# Patient Record
Sex: Female | Born: 1970 | Race: White | Hispanic: No | Marital: Single | Smoking: Never smoker
Health system: Southern US, Community
[De-identification: ages and names within clinical notes are randomized; demographics above are authoritative.]

## PROBLEM LIST (undated history)

## (undated) DIAGNOSIS — I10 Essential (primary) hypertension: Secondary | ICD-10-CM

## (undated) DIAGNOSIS — G43909 Migraine, unspecified, not intractable, without status migrainosus: Secondary | ICD-10-CM

## (undated) HISTORY — PX: CHOLECYSTECTOMY: SHX55

---

## 2004-09-30 ENCOUNTER — Emergency Department: Payer: Self-pay | Admitting: Emergency Medicine

## 2005-04-19 ENCOUNTER — Emergency Department: Payer: Self-pay | Admitting: Emergency Medicine

## 2007-12-13 ENCOUNTER — Emergency Department: Payer: Self-pay | Admitting: Internal Medicine

## 2008-05-09 ENCOUNTER — Emergency Department: Payer: Self-pay | Admitting: Emergency Medicine

## 2009-04-26 ENCOUNTER — Emergency Department: Payer: Self-pay | Admitting: Emergency Medicine

## 2009-08-23 ENCOUNTER — Emergency Department: Payer: Self-pay | Admitting: Emergency Medicine

## 2010-09-21 ENCOUNTER — Emergency Department: Payer: Self-pay | Admitting: Emergency Medicine

## 2012-04-11 ENCOUNTER — Emergency Department: Payer: Self-pay | Admitting: Emergency Medicine

## 2013-03-28 ENCOUNTER — Emergency Department: Payer: Self-pay | Admitting: Emergency Medicine

## 2013-09-22 ENCOUNTER — Emergency Department: Payer: Self-pay | Admitting: Internal Medicine

## 2013-12-29 ENCOUNTER — Emergency Department: Payer: Self-pay | Admitting: Emergency Medicine

## 2014-05-08 ENCOUNTER — Emergency Department: Payer: Self-pay | Admitting: Emergency Medicine

## 2014-10-05 ENCOUNTER — Emergency Department: Payer: Self-pay | Admitting: Emergency Medicine

## 2015-03-17 ENCOUNTER — Encounter: Payer: Self-pay | Admitting: Emergency Medicine

## 2015-03-17 ENCOUNTER — Emergency Department
Admission: EM | Admit: 2015-03-17 | Discharge: 2015-03-17 | Disposition: A | Discharge status: Home / Self Care | Payer: Self-pay | Attending: Emergency Medicine | Admitting: Emergency Medicine

## 2015-03-17 DIAGNOSIS — I10 Essential (primary) hypertension: Secondary | ICD-10-CM | POA: Insufficient documentation

## 2015-03-17 DIAGNOSIS — G43009 Migraine without aura, not intractable, without status migrainosus: Secondary | ICD-10-CM | POA: Insufficient documentation

## 2015-03-17 HISTORY — DX: Migraine, unspecified, not intractable, without status migrainosus: G43.909

## 2015-03-17 HISTORY — DX: Essential (primary) hypertension: I10

## 2015-03-17 MED ORDER — METOCLOPRAMIDE HCL 10 MG PO TABS
10.0000 mg | ORAL_TABLET | Freq: Once | ORAL | Status: AC
  Administered 2015-03-17: 10 mg via ORAL
  Filled 2015-03-17: qty 1

## 2015-03-17 MED ORDER — DIPHENHYDRAMINE HCL 50 MG PO CAPS
50.0000 mg | ORAL_CAPSULE | Freq: Once | ORAL | Status: AC
  Administered 2015-03-17: 50 mg via ORAL
  Filled 2015-03-17: qty 1

## 2015-03-17 MED ORDER — SUMATRIPTAN SUCCINATE 6 MG/0.5ML ~~LOC~~ SOLN
6.0000 mg | Freq: Once | SUBCUTANEOUS | Status: AC
  Administered 2015-03-17: 6 mg via SUBCUTANEOUS
  Filled 2015-03-17: qty 0.5

## 2015-03-17 NOTE — Discharge Instructions (Signed)
Recurrent Migraine Headache A migraine headache is very bad, throbbing pain on one or both sides of your head. Recurrent migraines keep coming back. Talk to your doctor about what things may bring on (trigger) your migraine headaches. HOME CARE  Only take medicines as told by your doctor.  Lie down in a dark, quiet room when you have a migraine.  Keep a journal to find out if certain things bring on migraine headaches. For example, write down:  What you eat and drink.  How much sleep you get.  Any change to your diet or medicines.  Lessen how much alcohol you drink.  Quit smoking if you smoke.  Get enough sleep.  Lessen any stress in your life.  Keep lights dim if bright lights bother you or make your migraines worse. GET HELP IF:  Medicine does not help your migraines.  Your pain keeps coming back.  You have a fever. GET HELP RIGHT AWAY IF:   Your migraine becomes really bad.  You have a stiff neck.  You have trouble seeing.  Your muscles are weak, or you lose muscle control.  You lose your balance or have trouble walking.  You feel like you will pass out (faint), or you pass out.  You have really bad symptoms that are different than your first symptoms. MAKE SURE YOU:   Understand these instructions.  Will watch your condition.  Will get help right away if you are not doing well or get worse. Document Released: 04/22/2008 Document Revised: 07/19/2013 Document Reviewed: 03/21/2013 [facility] Patient Information 2015 [place], [place]. This information is not intended to replace advice given to you by your health care provider. Make sure you discuss any questions you have with your health care provider.  Follow-up with your provider as needed. Take OTC Excedrin Migraine and Benadryl for headache pain relief.

## 2015-03-17 NOTE — ED Notes (Signed)
History of migraines 

## 2015-03-17 NOTE — ED Provider Notes (Signed)
[facility] Emergency Department Provider Note ____________________________________________  Time seen: 1411  I have reviewed the triage vital signs and the nursing notes.  HISTORY  Chief Complaint  Migraine  HPI Colleen Robinson is a 44 y.o. female reports to the ED with a current migraine headache and gives a history of migraines. Reports onset yesterday with nausea. She denies current migraine therapy. She reportedly used to take Imitrex injections for migraines. She denies vomiting, vertigo, focal weakness, or syncope. She rates her pain at 10/10 in triage.   Past Medical History  Diagnosis Date  . Migraines   . Hypertension    There are no active problems to display for this patient.  Past Surgical History  Procedure Laterality Date  . Cholecystectomy     No current outpatient prescriptions on file.  Allergies Review of patient's allergies indicates no known allergies.  No family history on file.  Social History Social History  Substance Use Topics  . Smoking status: Never Smoker   . Smokeless tobacco: None  . Alcohol Use: No   Review of Systems  Constitutional: Negative for fever. Eyes: Negative for visual changes. ENT: Negative for sore throat. Cardiovascular: Negative for chest pain. Respiratory: Negative for shortness of breath. Gastrointestinal: Negative for abdominal pain, vomiting and diarrhea. Genitourinary: Negative for dysuria. Musculoskeletal: Negative for back pain. Skin: Negative for rash. Neurological: Negative for focal weakness or numbness. Reports headache ____________________________________________  PHYSICAL EXAM:  VITAL SIGNS: ED Triage Vitals  Enc Vitals Group     BP 03/17/15 1321 156/93 mmHg     Pulse Rate 03/17/15 1321 73     Resp 03/17/15 1321 18     Temp 03/17/15 1321 98.1 F (36.7 C)     Temp Source 03/17/15 1321 Oral     SpO2 03/17/15 1321 100 %     Weight 03/17/15 1321 240 lb (108.863 kg)     Height  03/17/15 1321 5\' 8"  (1.727 m)     Head Cir --      Peak Flow --      Pain Score 03/17/15 1322 10     Pain Loc --      Pain Edu? --      Excl. in GC? --    Constitutional: Alert and oriented. Well appearing and in no distress. Eyes: Conjunctivae are normal. PERRL. Normal extraocular movements. Normal fundi bilaterally. Ears: Canals clear, TMs intact without bulging   Head: Normocephalic and atraumatic.   Nose: No congestion/rhinnorhea.   Mouth/Throat: Mucous membranes are moist.   Neck: Supple. No thyromegaly. Hematological/Lymphatic/Immunilogical: No cervical lymphadenopathy. Cardiovascular: Normal rate, regular rhythm.  Respiratory: Normal respiratory effort. No wheezes/rales/rhonchi. Gastrointestinal: Soft and nontender. No distention. Musculoskeletal: Nontender with normal range of motion in all extremities.  Neurologic:  Normal gait without ataxia. Normal speech and language. No gross focal neurologic deficits are appreciated. Skin:  Skin is warm, dry and intact. No rash noted. Psychiatric: Mood and affect are normal. Patient exhibits appropriate insight and judgment. ____________________  PROCEDURES Imitrex 6mg  SQ Reglan 10 mg PO Benadryl 50 mg PO ____________________________________________  INITIAL IMPRESSION / ASSESSMENT AND PLAN / ED COURSE  Patient with near resolution of headache pain after med administration. Suggest follow-up with [employer] or one of the community clinics for ongoing health maintenance. Suggest Excedrin Migraine and Benadryl for headache pain relief.  ____________________________________________  FINAL CLINICAL IMPRESSION(S) / ED DIAGNOSES  Final diagnoses:  Migraine without aura and without status migrainosus, not intractable     Shakenya Stoneberg  Kate Sable, PA-C 03/17/15 1526  Darien Ramus, MD 03/17/15 1539

## 2016-01-12 ENCOUNTER — Encounter: Payer: Self-pay | Admitting: Emergency Medicine

## 2016-01-12 ENCOUNTER — Emergency Department
Admission: EM | Admit: 2016-01-12 | Discharge: 2016-01-12 | Disposition: A | Discharge status: Home / Self Care | Payer: Self-pay | Attending: Emergency Medicine | Admitting: Emergency Medicine

## 2016-01-12 DIAGNOSIS — G43009 Migraine without aura, not intractable, without status migrainosus: Secondary | ICD-10-CM

## 2016-01-12 DIAGNOSIS — K029 Dental caries, unspecified: Secondary | ICD-10-CM

## 2016-01-12 DIAGNOSIS — I10 Essential (primary) hypertension: Secondary | ICD-10-CM | POA: Insufficient documentation

## 2016-01-12 MED ORDER — ONDANSETRON 4 MG PO TBDP
4.0000 mg | ORAL_TABLET | Freq: Once | ORAL | Status: AC
  Administered 2016-01-12: 4 mg via ORAL
  Filled 2016-01-12: qty 1

## 2016-01-12 MED ORDER — SUMATRIPTAN SUCCINATE 6 MG/0.5ML ~~LOC~~ SOLN
6.0000 mg | Freq: Once | SUBCUTANEOUS | Status: AC
  Administered 2016-01-12: 6 mg via SUBCUTANEOUS
  Filled 2016-01-12: qty 0.5

## 2016-01-12 MED ORDER — AMOXICILLIN 500 MG PO CAPS: 500.0000 mg | ORAL_CAPSULE | Freq: Three times a day (TID) | ORAL | Status: AC

## 2016-01-12 MED ORDER — IBUPROFEN 600 MG PO TABS: 600.0000 mg | ORAL_TABLET | Freq: Three times a day (TID) | ORAL | Status: AC | PRN

## 2016-01-12 NOTE — ED Notes (Signed)
Pt presents to ED with c/o front upper jaw/sinus pain and pressure d/t dental pain. Pt also states h/x of migraine HA. Pt reports N/V associated with HA, denies diarrhea or fever. Pt reports light and sound makes HA worse; pt reports taking Tylenol and Benadryl PTA with relief.

## 2016-01-12 NOTE — ED Notes (Signed)
Toothache and migraine x 2 days.

## 2016-01-12 NOTE — Discharge Instructions (Signed)
Begin taking amoxicillin for dental infection and ibuprofen as needed for dental pain. Follow-up with your primary care doctor or [facility] if any continued problems with migraines.

## 2016-01-12 NOTE — ED Notes (Signed)
Discussed discharge instructions, prescriptions, and follow-up care with patient. No questions or concerns at this time. Pt stable at discharge.  

## 2016-01-12 NOTE — ED Provider Notes (Signed)
[facility] Emergency Department Provider Note   ____________________________________________  Time seen: Approximately 2:55 PM  I have reviewed the triage vital signs and the nursing notes.   HISTORY  Chief Complaint Dental Pain and Migraine   HPI Colleen Robinson is a 45 y.o. female history complaint of dental pain. Patient states that she has left upper dental pain along with a migraine that is been going on for approximately 2 days.  Patient is aware that she needs dental work doneand has a history of migraines. In the past she has them treated with Imitrex with complete relief of her headaches. She states that it is been 2-3 years since she has seen her primary care doctor at [facility] primary. She states the headache that she is experiencing today is like her normal migraine. She is experienced nausea but no vomiting. She has taken over-the-counter medication without any relief. She denies any visual changes, fever or chills. Currently she does not have any insurance and states that she is unable see her PCP at [facility] primary or make an appointment for dentist. Currently she rates her pain as an 8/10.   Past Medical History  Diagnosis Date  . Migraines   . Hypertension     There are no active problems to display for this patient.   Past Surgical History  Procedure Laterality Date  . Cholecystectomy      Current Outpatient Rx  Name  Route  Sig  Dispense  Refill  . amoxicillin (AMOXIL) 500 MG capsule   Oral   Take 1 capsule (500 mg total) by mouth 3 (three) times daily.   30 capsule   0   . ibuprofen (ADVIL,MOTRIN) 600 MG tablet   Oral   Take 1 tablet (600 mg total) by mouth every 8 (eight) hours as needed.   30 tablet   0     Allergies Review of patient's allergies indicates no known allergies.  No family history on file.  Social History Social History  Substance Use Topics  . Smoking status: Never Smoker   . Smokeless tobacco: None  .  Alcohol Use: No    Review of Systems Constitutional: No fever/chills Eyes: Positive photophobia ENT: Positive dental pain. Cardiovascular: Denies chest pain. Respiratory: Denies shortness of breath. Gastrointestinal: No abdominal pain.  Positive nausea, no vomiting.   Musculoskeletal: Negative for back pain. Skin: Negative for rash. Neurological: Positive for headaches, no focal weakness or numbness.  10-point ROS otherwise negative.  ____________________________________________   PHYSICAL EXAM:  VITAL SIGNS: ED Triage Vitals  Enc Vitals Group     BP 01/12/16 1413 133/98 mmHg     Pulse Rate 01/12/16 1413 110     Resp 01/12/16 1413 18     Temp 01/12/16 1413 98.3 F (36.8 C)     Temp Source 01/12/16 1413 Oral     SpO2 01/12/16 1413 99 %     Weight 01/12/16 1413 240 lb (108.863 kg)     Height 01/12/16 1413  (1.753 m)     Head Cir --      Peak Flow --      Pain Score 01/12/16 1414 8     Pain Loc --      Pain Edu? --      Excl. in GC? --     Constitutional: Alert and oriented. Well appearing and in no acute distress.Patient is sitting in a dimly lit room secondary to headache. Patient answers questions appropriately. Eyes: Conjunctivae are  normal. PERRL. EOMI. Head: Atraumatic. Nose: No congestion/rhinnorhea. Mouth/Throat: Mucous membranes are moist.  Oropharynx non-erythematous. There are numerous caries present upper front teeth. There is some gum tenderness along with some minimal swelling. All teeth are in poor hygiene. Neck: No stridor.  Range of motion of cervical spine is within normal limits and without restriction. Hematological/Lymphatic/Immunilogical: No cervical lymphadenopathy. Cardiovascular: Normal rate, regular rhythm. Grossly normal heart sounds.  Good peripheral circulation. Respiratory: Normal respiratory effort.  No retractions. Lungs CTAB. Musculoskeletal: Moves upper and lower extremities without any difficulty. Normal gait was  noted. Neurologic:  Normal speech and language. No gross focal neurologic deficits are appreciated. No gait instability. Cranial nerves II through XII grossly intact. Reflexes patella tendons 2+ bilaterally. Skin:  Skin is warm, dry and intact. No rash noted. Psychiatric: Mood and affect are normal. Speech and behavior are normal.  ____________________________________________   LABS (all labs ordered are listed, but only abnormal results are displayed)  Labs Reviewed - No data to display  PROCEDURES  Procedure(s) performed: None  Critical Care performed: No  ____________________________________________   INITIAL IMPRESSION / ASSESSMENT AND PLAN / ED COURSE  Pertinent labs & imaging results that were available during my care of the patient were reviewed by me and considered in my medical decision making (see chart for details).  By discharge patient was completely headache free. Patient continues to have some dental discomfort. She was encouraged to make an appointment with the dentist. A list of dental clinics was given to the patient. Patient is started on amoxicillin 500 mg 3 times a day for 10 days and take ibuprofen as needed for pain and discomfort. She is encouraged to reestablish her PCP for treatment of her migraines. ____________________________________________   FINAL CLINICAL IMPRESSION(S) / ED DIAGNOSES  Final diagnoses:  Migraine without aura and without status migrainosus, not intractable  Dental caries  Pain due to dental caries      NEW MEDICATIONS STARTED DURING THIS VISIT:  Discharge Medication List as of 01/12/2016  5:29 PM    START taking these medications   Details  amoxicillin (AMOXIL) 500 MG capsule Take 1 capsule (500 mg total) by mouth 3 (three) times daily., Starting 01/12/2016, Until Discontinued, Print    ibuprofen (ADVIL,MOTRIN) 600 MG tablet Take 1 tablet (600 mg total) by mouth every 8 (eight) hours as needed., Starting 01/12/2016, Until  Discontinued, Print         Note:  This document was prepared using Dragon voice recognition software and may include unintentional dictation errors.    Tommi RumpsRhonda L Summers, PA-C 01/12/16 1748  Jennye MoccasinBrian S Quigley, MD 01/13/16 [phone]

## 2016-03-31 ENCOUNTER — Emergency Department
Admission: EM | Admit: 2016-03-31 | Discharge: 2016-03-31 | Disposition: A | Discharge status: Home / Self Care | Payer: Self-pay | Attending: Emergency Medicine | Admitting: Emergency Medicine

## 2016-03-31 ENCOUNTER — Encounter: Payer: Self-pay | Admitting: *Deleted

## 2016-03-31 DIAGNOSIS — Z792 Long term (current) use of antibiotics: Secondary | ICD-10-CM | POA: Insufficient documentation

## 2016-03-31 DIAGNOSIS — Z791 Long term (current) use of non-steroidal anti-inflammatories (NSAID): Secondary | ICD-10-CM | POA: Insufficient documentation

## 2016-03-31 DIAGNOSIS — I10 Essential (primary) hypertension: Secondary | ICD-10-CM | POA: Insufficient documentation

## 2016-03-31 DIAGNOSIS — G43809 Other migraine, not intractable, without status migrainosus: Secondary | ICD-10-CM

## 2016-03-31 MED ORDER — SUMATRIPTAN SUCCINATE 6 MG/0.5ML ~~LOC~~ SOLN
6.0000 mg | Freq: Once | SUBCUTANEOUS | Status: AC
  Administered 2016-03-31: 6 mg via SUBCUTANEOUS
  Filled 2016-03-31 (×2): qty 0.5

## 2016-03-31 NOTE — Discharge Instructions (Signed)
Please seek medical attention for any high fevers, chest pain, shortness of breath, change in behavior, persistent vomiting, bloody stool or any other new or concerning symptoms.  

## 2016-03-31 NOTE — ED Triage Notes (Signed)
States migraine for 2 days, states hx of migraines, states she is unable to afford her imitrex

## 2016-03-31 NOTE — ED Provider Notes (Signed)
[facility] Emergency Department Provider Note    ____________________________________________   I have reviewed the triage vital signs and the nursing notes.   HISTORY  Chief Complaint Migraine   History limited by: Not Limited   HPI Colleen Robinson is a 45 y.o. female with history of migraines who presents to the emergency department today because of headache. It is been present for the past 2 days. It is located behind and above her eyes. It has been fairly constant. It is been associated with some nausea. It feels like her previous migraines. She denies any recent head trauma. She currently does not have a primary care doctor nor any migraine medication.   Past Medical History:  Diagnosis Date  . Hypertension   . Migraines     There are no active problems to display for this patient.   Past Surgical History:  Procedure Laterality Date  . CHOLECYSTECTOMY      Prior to Admission medications   Medication Sig Start Date End Date Taking? Authorizing Provider  amoxicillin (AMOXIL) 500 MG capsule Take 1 capsule (500 mg total) by mouth 3 (three) times daily. 01/12/16   Tommi Rumps, PA-C  ibuprofen (ADVIL,MOTRIN) 600 MG tablet Take 1 tablet (600 mg total) by mouth every 8 (eight) hours as needed. 01/12/16   Tommi Rumps, PA-C    Allergies Review of patient's allergies indicates no known allergies.  History reviewed. No pertinent family history.  Social History Social History  Substance Use Topics  . Smoking status: Never Smoker  . Smokeless tobacco: Not on file  . Alcohol use No    Review of Systems  Constitutional: Negative for fever. Cardiovascular: Negative for chest pain. Respiratory: Negative for shortness of breath. Gastrointestinal: Negative for abdominal pain, vomiting and diarrhea. Neurological: Positive for headache.  10-point ROS otherwise negative.  ____________________________________________   PHYSICAL  EXAM:  VITAL SIGNS: ED Triage Vitals [03/31/16 1701]  Enc Vitals Group     BP (!) 158/94     Pulse Rate 86     Resp 18     Temp 98 F (36.7 C)     Temp Source Oral     SpO2 100 %     Weight 230 lb (104.3 kg)     Height 5\' 8"  (1.727 m)     Head Circumference      Peak Flow      Pain Score 9     Pain Loc    Constitutional: Alert and oriented. Well appearing and in no distress. Eyes: Conjunctivae are normal. Normal extraocular movements. ENT   Head: Normocephalic and atraumatic.   Nose: No congestion/rhinnorhea.   Mouth/Throat: Mucous membranes are moist.   Neck: No stridor. Hematological/Lymphatic/Immunilogical: No cervical lymphadenopathy. Cardiovascular: Normal rate, regular rhythm.  No murmurs, rubs, or gallops. Respiratory: Normal respiratory effort without tachypnea nor retractions. Breath sounds are clear and equal bilaterally. No wheezes/rales/rhonchi. Gastrointestinal: Soft and nontender. No distention.  Genitourinary: Deferred Musculoskeletal: Normal range of motion in all extremities. No lower extremity edema. Neurologic:  Normal speech and language. No gross focal neurologic deficits are appreciated.  Skin:  Skin is warm, dry and intact. No rash noted. Psychiatric: Mood and affect are normal. Speech and behavior are normal. Patient exhibits appropriate insight and judgment.  ____________________________________________    LABS (pertinent positives/negatives)  Labs Reviewed - No data to display   ____________________________________________   EKG  None  ____________________________________________    RADIOLOGY  None  ____________________________________________   PROCEDURES  Procedures  ____________________________________________   INITIAL IMPRESSION / ASSESSMENT AND PLAN / ED COURSE  Pertinent labs & imaging results that were available during my care of the patient were reviewed by me and considered in my medical decision  making (see chart for details).  Patient with history of migraines presents to the emergency department tonight with a typical migraine. She was given Imitrex and stated she felt much better afterwards. Will discharge home. ____________________________________________   FINAL CLINICAL IMPRESSION(S) / ED DIAGNOSES  Final diagnoses:  Other type of migraine     Note: This dictation was prepared with Dragon dictation. Any transcriptional errors that result from this process are unintentional    Phineas SemenGraydon Teandra Harlan, MD 03/31/16 1956

## 2017-01-22 ENCOUNTER — Emergency Department
Admission: EM | Admit: 2017-01-22 | Discharge: 2017-01-22 | Disposition: A | Discharge status: Home / Self Care | Payer: Self-pay | Attending: Emergency Medicine | Admitting: Emergency Medicine

## 2017-01-22 ENCOUNTER — Encounter: Payer: Self-pay | Admitting: Emergency Medicine

## 2017-01-22 DIAGNOSIS — I10 Essential (primary) hypertension: Secondary | ICD-10-CM | POA: Insufficient documentation

## 2017-01-22 DIAGNOSIS — G43901 Migraine, unspecified, not intractable, with status migrainosus: Secondary | ICD-10-CM | POA: Insufficient documentation

## 2017-01-22 DIAGNOSIS — H53149 Visual discomfort, unspecified: Secondary | ICD-10-CM | POA: Insufficient documentation

## 2017-01-22 DIAGNOSIS — R112 Nausea with vomiting, unspecified: Secondary | ICD-10-CM | POA: Insufficient documentation

## 2017-01-22 MED ORDER — PROMETHAZINE HCL 25 MG/ML IJ SOLN
12.5000 mg | Freq: Once | INTRAMUSCULAR | Status: AC
  Administered 2017-01-22: 12.5 mg via INTRAMUSCULAR
  Filled 2017-01-22: qty 1

## 2017-01-22 MED ORDER — DIPHENHYDRAMINE HCL 25 MG PO CAPS
50.0000 mg | ORAL_CAPSULE | Freq: Once | ORAL | Status: AC
  Administered 2017-01-22: 50 mg via ORAL
  Filled 2017-01-22: qty 2

## 2017-01-22 MED ORDER — PROMETHAZINE HCL 25 MG/ML IJ SOLN: 12.5000 mg | Freq: Once | INTRAMUSCULAR | Status: DC

## 2017-01-22 MED ORDER — SUMATRIPTAN SUCCINATE 6 MG/0.5ML ~~LOC~~ SOLN
6.0000 mg | Freq: Once | SUBCUTANEOUS | Status: AC
  Administered 2017-01-22: 6 mg via SUBCUTANEOUS
  Filled 2017-01-22: qty 0.5

## 2017-01-22 NOTE — ED Triage Notes (Addendum)
Pt c/o migraine headache, hx of same.  Started today.  Has had vomiting.  Photophobia.  Has not been able to get her BP meds or migraine medication because without insurance.  Feels like her normal migraines just does not have her medication. Reports just needs something for nausea/headache.

## 2017-01-22 NOTE — ED Provider Notes (Signed)
[facility] Emergency Department Provider Note  ____________________________________________  Time seen: Approximately 5:46 PM  I have reviewed the triage vital signs and the nursing notes.   HISTORY  Chief Complaint Headache    HPI Colleen Robinson is a 46 y.o. female presenting to the emergency department with 10 out of 10 frontal headache that started this morning. Patient has associated photophobia, phonophobia, nausea and vomiting. Patient has a history of chronic migraines. Patient states that it has been approximately 1 month since she abstained from a pharmacologic regimen for essential hypertension. She states that she is in the process of seeking care with a primary care provider. Patient states that this is not the worst headache of her life. She is ambulating without difficulty. She denies associated blurry vision. Patient is not currently working. She enjoys spending time with her 74-year-old granddaughter in her spare time.   Past Medical History:  Diagnosis Date  . Hypertension   . Migraines     There are no active problems to display for this patient.   Past Surgical History:  Procedure Laterality Date  . CHOLECYSTECTOMY      Prior to Admission medications   Medication Sig Start Date End Date Taking? Authorizing Provider  amoxicillin (AMOXIL) 500 MG capsule Take 1 capsule (500 mg total) by mouth 3 (three) times daily. 01/12/16   Tommi Rumps, PA-C  ibuprofen (ADVIL,MOTRIN) 600 MG tablet Take 1 tablet (600 mg total) by mouth every 8 (eight) hours as needed. 01/12/16   Tommi Rumps, PA-C    Allergies Patient has no known allergies.  History reviewed. No pertinent family history.  Social History Social History  Substance Use Topics  . Smoking status: Never Smoker  . Smokeless tobacco: Not on file  . Alcohol use No    Review of Systems  Constitutional: No fever/chills Eyes: No visual changes. No discharge ENT: No upper  respiratory complaints. Cardiovascular: no chest pain. Respiratory: no cough. No SOB. Gastrointestinal: Patient has nausea and vomiting.  Musculoskeletal: Negative for musculoskeletal pain. Skin: Negative for rash, abrasions, lacerations, ecchymosis. Neurological: Patient has headache, no focal weakness or numbness.   ____________________________________________   PHYSICAL EXAM:  VITAL SIGNS: ED Triage Vitals  Enc Vitals Group     BP 01/22/17 1713 (!) 179/95     Pulse Rate 01/22/17 1713 80     Resp 01/22/17 1713 18     Temp 01/22/17 1713 98.2 F (36.8 C)     Temp Source 01/22/17 1713 Oral     SpO2 01/22/17 1713 100 %     Weight 01/22/17 1710 230 lb (104.3 kg)     Height 01/22/17 1710 5\' 8"  (1.727 m)     Head Circumference --      Peak Flow --      Pain Score 01/22/17 1709 10     Pain Loc --      Pain Edu? --      Excl. in GC? --    Constitutional: Alert and oriented. Patient is talkative and engaged.  Eyes: Palpebral and bulbar conjunctiva are nonerythematous bilaterally. PERRL. EOMI.  Head: Atraumatic. ENT: Neck: Full range of motion. No pain with neck flexion. No pain with palpation of the cervical spine.  Cardiovascular: No pain with palpation over the anterior and posterior chest wall. Normal rate, regular rhythm. Normal S1 and S2. No murmurs, gallops or rubs auscultated.  Respiratory: Trachea is midline. Resonant and symmetric percussion tones bilaterally. On auscultation, adventitious sounds are absent.  Musculoskeletal: Patient has 5/5 strength in the upper and lower extremities bilaterally. Full range of motion at the shoulder, elbow and wrist bilaterally. Full range of motion at the hip, knee and ankle bilaterally. No changes in gait. Palpable radial, ulnar and dorsalis pedis pulses bilaterally and symmetrically. Neurologic: Normal speech and language. No gross focal neurologic deficits are appreciated. Cranial nerves: 2-10 normal as tested. Cerebellar:  Finger-nose-finger WNL, heel to shin WNL. Sensorimotor: No sensory loss or abnormal reflexes. Vision: No visual field deficts noted to confrontation.  Speech: No dysarthria or expressive aphasia.  Skin:  Skin is warm, dry and intact. No rash or bruising noted.  Psychiatric: Mood and affect are normal for age. Speech and behavior are normal.    ____________________________________________   LABS (all labs ordered are listed, but only abnormal results are displayed)  Labs Reviewed - No data to display ____________________________________________  EKG   ____________________________________________  RADIOLOGY   No results found.  ____________________________________________    PROCEDURES  Procedure(s) performed:    Procedures    Medications  SUMAtriptan (IMITREX) injection 6 mg (6 mg Subcutaneous Given 01/22/17 1805)  diphenhydrAMINE (BENADRYL) capsule 50 mg (50 mg Oral Given 01/22/17 1801)  promethazine (PHENERGAN) injection 12.5 mg (12.5 mg Intramuscular Given 01/22/17 1802)     ____________________________________________   INITIAL IMPRESSION / ASSESSMENT AND PLAN / ED COURSE  Pertinent labs & imaging results that were available during my care of the patient were reviewed by me and considered in my medical decision making (see chart for details).  Review of the [place] CSRS was performed in accordance of the [facility] prior to dispensing any controlled drugs.     Assessment and plan: Migraine: Patient presents to the emergency department with a migraine. She was given Imitrex, Phenergan and Benadryl in the emergency department. Patient reports that this is not the worst headache of her life. Physical exam is reassuring. Further workup with a CT head is not warranted at this time. Patient reported that her headache improved significantly after aforementioned medications were administered. Patient was advised to follow up with primary care for essential hypertension.  Patient voiced understanding regarding this recommendation.   ____________________________________________  FINAL CLINICAL IMPRESSION(S) / ED DIAGNOSES  Final diagnoses:  Migraine with status migrainosus, not intractable, unspecified migraine type      NEW MEDICATIONS STARTED DURING THIS VISIT:  New Prescriptions   No medications on file        This chart was dictated using voice recognition software/Dragon. Despite best efforts to proofread, errors can occur which can change the meaning. Any change was purely unintentional.    Orvil FeilWoods, Terrace Fontanilla M, PA-C 01/22/17 1906    Jeanmarie PlantMcShane, James A, MD 01/22/17 1945

## 2017-02-08 ENCOUNTER — Emergency Department
Admission: EM | Admit: 2017-02-08 | Discharge: 2017-02-09 | Disposition: A | Discharge status: Home / Self Care | Payer: Self-pay | Attending: Student in an Organized Health Care Education/Training Program | Admitting: Student in an Organized Health Care Education/Training Program

## 2017-02-08 DIAGNOSIS — G43809 Other migraine, not intractable, without status migrainosus: Secondary | ICD-10-CM | POA: Insufficient documentation

## 2017-02-08 DIAGNOSIS — I10 Essential (primary) hypertension: Secondary | ICD-10-CM | POA: Insufficient documentation

## 2017-02-08 DIAGNOSIS — Z79899 Other long term (current) drug therapy: Secondary | ICD-10-CM | POA: Insufficient documentation

## 2017-02-08 MED ORDER — SUMATRIPTAN SUCCINATE 6 MG/0.5ML ~~LOC~~ SOLN
6.0000 mg | Freq: Once | SUBCUTANEOUS | Status: AC
  Administered 2017-02-08: 6 mg via SUBCUTANEOUS
  Filled 2017-02-08: qty 0.5

## 2017-02-08 MED ORDER — PROMETHAZINE HCL 25 MG/ML IJ SOLN
25.0000 mg | Freq: Four times a day (QID) | INTRAMUSCULAR | Status: DC | PRN
  Administered 2017-02-08: 25 mg via INTRAMUSCULAR
  Filled 2017-02-08: qty 1

## 2017-02-08 MED ORDER — SUMATRIPTAN 5 MG/ACT NA SOLN: 1.0000 | NASAL | 0 refills | Status: AC | PRN

## 2017-02-08 NOTE — Discharge Instructions (Signed)

## 2017-02-08 NOTE — ED Notes (Signed)
Dr Roxan Hockeyrobinson at bedside, pt states that she woke up with a migraine, states hx of migraines and has used imitrex in the past but states that she doesn't have insurance and is unable to afford it. Pt denies any neurological s/s but does report nausea. Pt has equal facial symetry and equal body movements of her extremities. Pupils are 2 mm bilat and brisk

## 2017-02-08 NOTE — ED Provider Notes (Signed)
[facility] Emergency Department Provider Note    First MD Initiated Contact with Patient 02/08/17 2210     (approximate)  I have reviewed the triage vital signs and the nursing notes.   HISTORY  Chief Complaint Migraine (History of same)    HPI Karen Kitchensmanda D Scheff is a 46 y.o. female with a history of migraines presents with headache associated with nausea vomiting that started this morning. Feels similar to previous migraine headaches. States that she typically gets significant relief after Imitrex but ran out of her medications that she lost her insurance. Denies any fevers. No neck pain. No numbness or tingling.   Past Medical History:  Diagnosis Date  . Hypertension   . Migraines    No family history on file. Past Surgical History:  Procedure Laterality Date  . CHOLECYSTECTOMY     There are no active problems to display for this patient.     Prior to Admission medications   Medication Sig Start Date End Date Taking? Authorizing Provider  amoxicillin (AMOXIL) 500 MG capsule Take 1 capsule (500 mg total) by mouth 3 (three) times daily. 01/12/16   Tommi RumpsSummers, Rhonda L, PA-C  ibuprofen (ADVIL,MOTRIN) 600 MG tablet Take 1 tablet (600 mg total) by mouth every 8 (eight) hours as needed. 01/12/16   Tommi RumpsSummers, Rhonda L, PA-C    Allergies Patient has no known allergies.    Social History Social History  Substance Use Topics  . Smoking status: Never Smoker  . Smokeless tobacco: Not on file  . Alcohol use No    Review of Systems Patient denies headaches, rhinorrhea, blurry vision, numbness, shortness of breath, chest pain, edema, cough, abdominal pain, nausea, vomiting, diarrhea, dysuria, fevers, rashes or hallucinations unless otherwise stated above in HPI. ____________________________________________   PHYSICAL EXAM:  VITAL SIGNS: Vitals:   02/08/17 1931  BP: (!) 156/86  Pulse: 73  Resp: 18  Temp: 98.4 F (36.9 C)    Constitutional: Alert  and oriented. Well appearing and in no acute distress. Eyes: Conjunctivae are normal.  Head: Atraumatic. Nose: No congestion/rhinnorhea. Mouth/Throat: Mucous membranes are moist.   Neck: No stridor. Painless ROM.  Cardiovascular: Normal rate, regular rhythm. Grossly normal heart sounds.  Good peripheral circulation. Respiratory: Normal respiratory effort.  No retractions. Lungs CTAB. Gastrointestinal: Soft and nontender. No distention. No abdominal bruits. No CVA tenderness. Genitourinary:  Musculoskeletal: No lower extremity tenderness nor edema.  No joint effusions. Neurologic:  Normal speech and language. No gross focal neurologic deficits are appreciated. No facial droop Skin:  Skin is warm, dry and intact. No rash noted. Psychiatric: Mood and affect are normal. Speech and behavior are normal.  ____________________________________________   LABS (all labs ordered are listed, but only abnormal results are displayed)  No results found for this or any previous visit (from the past 24 hour(s)). ____________________________________________ ____________________________________________   PROCEDURES  Procedure(s) performed:  Procedures    Critical Care performed: no ____________________________________________   INITIAL IMPRESSION / ASSESSMENT AND PLAN / ED COURSE  Pertinent labs & imaging results that were available during my care of the patient were reviewed by me and considered in my medical decision making (see chart for details).  DDX: migraine, tension, cluster  Karen Kitchensmanda D Blattner is a 46 y.o. who presents to the ED with with Hx of migraines p/w HA starting today.  Not worst HA ever. Gradual onset. HA similar to previous episodes. Denies focal neurologic symptoms. Denies trauma. No fevers or neck pain. No vision loss. Afebrile in  ED. VSS. Exam as above. No meningeal signs. No CN, motor, sensory or cerebellar deficits. Temporal arteries palpable and non-tender. Appears well and  non-toxic.  Will provide IM imitrex as this has been very effective in the past    Clinical Course as of Feb 09 2340  Sun Feb 08, 2017  2337 Patient reassessed. Symptoms markedly improved after Imitrex. Patient tolerating oral hydration able to and related with steady gait.  [PR]    Clinical Course User Index [PR] Willy Eddy, MD     ____________________________________________   FINAL CLINICAL IMPRESSION(S) / ED DIAGNOSES  Final diagnoses:  Other migraine without status migrainosus, not intractable      NEW MEDICATIONS STARTED DURING THIS VISIT:  New Prescriptions   No medications on file     Note:  This document was prepared using Dragon voice recognition software and may include unintentional dictation errors.    Willy Eddy, MD 02/08/17 [phone]

## 2017-02-08 NOTE — ED Triage Notes (Signed)
Onset this morning of left sided migraine associated with N/V and photo and phonophobia. Patient is able to keep eyes open and speaks in complete sentences. Has a prescription for Imitrex injection but is out of it at home. That is written by Dr. Letta PateAycock at [facility] [place].

## 2017-02-08 NOTE — ED Notes (Signed)
Pt resting quietly with lights turned down watching tv, cont to monitor

## 2017-02-09 NOTE — ED Notes (Signed)
Pt states that she is feeling better, no distress noted, cont to monitor

## 2017-06-30 ENCOUNTER — Encounter: Payer: Self-pay | Admitting: *Deleted

## 2017-06-30 ENCOUNTER — Emergency Department
Admission: EM | Admit: 2017-06-30 | Discharge: 2017-06-30 | Disposition: A | Discharge status: Home / Self Care | Payer: Self-pay | Attending: Emergency Medicine | Admitting: Emergency Medicine

## 2017-06-30 ENCOUNTER — Emergency Department: Payer: Self-pay

## 2017-06-30 ENCOUNTER — Other Ambulatory Visit: Payer: Self-pay

## 2017-06-30 DIAGNOSIS — Z79899 Other long term (current) drug therapy: Secondary | ICD-10-CM | POA: Insufficient documentation

## 2017-06-30 DIAGNOSIS — Y929 Unspecified place or not applicable: Secondary | ICD-10-CM | POA: Insufficient documentation

## 2017-06-30 DIAGNOSIS — Y939 Activity, unspecified: Secondary | ICD-10-CM | POA: Insufficient documentation

## 2017-06-30 DIAGNOSIS — S99911A Unspecified injury of right ankle, initial encounter: Secondary | ICD-10-CM

## 2017-06-30 DIAGNOSIS — S99912A Unspecified injury of left ankle, initial encounter: Secondary | ICD-10-CM | POA: Insufficient documentation

## 2017-06-30 DIAGNOSIS — W108XXA Fall (on) (from) other stairs and steps, initial encounter: Secondary | ICD-10-CM | POA: Insufficient documentation

## 2017-06-30 DIAGNOSIS — I10 Essential (primary) hypertension: Secondary | ICD-10-CM | POA: Insufficient documentation

## 2017-06-30 DIAGNOSIS — Y999 Unspecified external cause status: Secondary | ICD-10-CM | POA: Insufficient documentation

## 2017-06-30 NOTE — ED Provider Notes (Signed)
[facility] Emergency Department Provider Note  ____________________________________________  Time seen: Approximately 9:26 PM  I have reviewed the triage vital signs and the nursing notes.   HISTORY  Chief Complaint Fall and Ankle Pain    HPI Colleen Robinson is a 46 y.o. female that presents to the emergency department for evaluation of ankle pain after tripping down stairs.  Patient states that she was carrying her granddaughter and her left arm and a car seat in her right arm, which caused her to trip.  She believes she fell down about 3 steps.  She is not sure which way she rolled her left ankle.  Patient has had difficulty moving left ankle since injury.  She has been walking with a walker.  No additional injuries.  She did not hit her head or lose consciousness.  No shortness breath, chest pain, nausea, vomiting, abdominal pain, numbness, tingling.  Past Medical History:  Diagnosis Date  . Hypertension   . Migraines     There are no active problems to display for this patient.   Past Surgical History:  Procedure Laterality Date  . CHOLECYSTECTOMY      Prior to Admission medications   Medication Sig Start Date End Date Taking? Authorizing Provider  amoxicillin (AMOXIL) 500 MG capsule Take 1 capsule (500 mg total) by mouth 3 (three) times daily. 01/12/16   Tommi RumpsSummers, Rhonda L, PA-C  ibuprofen (ADVIL,MOTRIN) 600 MG tablet Take 1 tablet (600 mg total) by mouth every 8 (eight) hours as needed. 01/12/16   Tommi RumpsSummers, Rhonda L, PA-C  SUMAtriptan (IMITREX) 5 MG/ACT nasal spray Place 1 spray (5 mg total) into the nose every 2 (two) hours as needed for migraine. 02/08/17   Willy Eddyobinson, Patrick, MD    Allergies Patient has no known allergies.  History reviewed. No pertinent family history.  Social History Social History   Tobacco Use  . Smoking status: Never Smoker  . Smokeless tobacco: Never Used  Substance Use Topics  . Alcohol use: No  . Drug use: No      Review of Systems  Constitutional: No fever/chills Cardiovascular: No chest pain. Respiratory: No SOB. Gastrointestinal: No abdominal pain.  No nausea, no vomiting.  Musculoskeletal: Positive for ankle pain. Skin: Negative for rash, abrasions, lacerations, ecchymosis. Neurological: Negative for headaches, numbness or tingling   ____________________________________________   PHYSICAL EXAM:  VITAL SIGNS: ED Triage Vitals  Enc Vitals Group     BP 06/30/17 1950 (!) 165/87     Pulse Rate 06/30/17 1950 85     Resp 06/30/17 1950 18     Temp 06/30/17 1950 98.2 F (36.8 C)     Temp Source 06/30/17 1950 Oral     SpO2 06/30/17 1950 100 %     Weight 06/30/17 1950 220 lb (99.8 kg)     Height 06/30/17 1950 5\' 9"  (1.753 m)     Head Circumference --      Peak Flow --      Pain Score 06/30/17 2002 8     Pain Loc --      Pain Edu? --      Excl. in GC? --      Constitutional: Alert and oriented. Well appearing and in no acute distress. Eyes: Conjunctivae are normal. PERRL. EOMI. Head: Atraumatic. ENT:      Ears:      Nose: No congestion/rhinnorhea.      Mouth/Throat: Mucous membranes are moist.  Neck: No stridor.   Cardiovascular: Normal rate, regular rhythm.  Good peripheral circulation.  Symmetric dorsalis pedis pulses bilaterally. Respiratory: Normal respiratory effort without tachypnea or retractions. Lungs CTAB. Good air entry to the bases with no decreased or absent breath sounds. Musculoskeletal: Full range of motion to all extremities. No gross deformities appreciated.  Tenderness to palpation over medial malleolus.  No visible swelling or bruising. Neurologic:  Normal speech and language. No gross focal neurologic deficits are appreciated.  Skin:  Skin is warm, dry and intact. No rash noted.   ____________________________________________   LABS (all labs ordered are listed, but only abnormal results are displayed)  Labs Reviewed - No data to  display ____________________________________________  EKG   ____________________________________________  RADIOLOGY Lexine BatonI, Mackenna Kamer, personally viewed and evaluated these images (plain radiographs) as part of my medical decision making, as well as reviewing the written report by the radiologist.  Dg Ankle Complete Left  Result Date: 06/30/2017 CLINICAL DATA:  Left ankle pain after injury down stairs tonight. EXAM: LEFT ANKLE COMPLETE - 3+ VIEW COMPARISON:  None. FINDINGS: There is no evidence of fracture, dislocation, or joint effusion. There is no evidence of arthropathy or other focal bone abnormality. Soft tissues are unremarkable. IMPRESSION: Normal left ankle. Electronically Signed   By: Lupita RaiderJames  Green Jr, M.D.   On: 06/30/2017 20:33    ____________________________________________    PROCEDURES  Procedure(s) performed:    Procedures    Medications - No data to display   ____________________________________________   INITIAL IMPRESSION / ASSESSMENT AND PLAN / ED COURSE  Pertinent labs & imaging results that were available during my care of the patient were reviewed by me and considered in my medical decision making (see chart for details).  Review of the [place] CSRS was performed in accordance of the [facility] prior to dispensing any controlled drugs.     Patient presented to the emergency department for evaluation of ankle injury.  Vital signs and exam are reassuring.  X-ray negative for acute bony abnormality.  Ankle was ace wrapped.  Patient requested a walker.  Patient will be discharged home with prescriptions for ibuprofen. Patient is to follow up with PCP as directed. Patient is given ED precautions to return to the ED for any worsening or new symptoms.     ____________________________________________  FINAL CLINICAL IMPRESSION(S) / ED DIAGNOSES  Final diagnoses:  Injury of right ankle, initial encounter      NEW MEDICATIONS STARTED DURING THIS VISIT:  ED  Discharge Orders    None          This chart was dictated using voice recognition software/Dragon. Despite best efforts to proofread, errors can occur which can change the meaning. Any change was purely unintentional.    Enid DerryWagner, Ettel Albergo, PA-C 06/30/17 2304    Pershing ProudSchaevitz, Myra Rudeavid Matthew, MD 06/30/17 [phone]

## 2017-06-30 NOTE — ED Triage Notes (Signed)
PT to Ed reporting having tripped going down the stairs tonight and having rolled left ankle. Left ankle pain at this time as well as left hand pain reported. Pt reports she is not able to bear weight or move left ankle. Pulse is intact. Sensation and color are appropriate.

## 2017-06-30 NOTE — ED Notes (Signed)
Pt reports that left foot/ankle was injured when she fell down the front steps and twisted left ankle

## 2017-08-27 ENCOUNTER — Encounter: Payer: Self-pay | Admitting: Emergency Medicine

## 2017-08-27 ENCOUNTER — Emergency Department
Admission: EM | Admit: 2017-08-27 | Discharge: 2017-08-27 | Disposition: A | Discharge status: Home / Self Care | Payer: No Typology Code available for payment source | Attending: Emergency Medicine | Admitting: Emergency Medicine

## 2017-08-27 DIAGNOSIS — Y9389 Activity, other specified: Secondary | ICD-10-CM | POA: Insufficient documentation

## 2017-08-27 DIAGNOSIS — Z79899 Other long term (current) drug therapy: Secondary | ICD-10-CM | POA: Insufficient documentation

## 2017-08-27 DIAGNOSIS — I10 Essential (primary) hypertension: Secondary | ICD-10-CM | POA: Diagnosis not present

## 2017-08-27 DIAGNOSIS — S39012A Strain of muscle, fascia and tendon of lower back, initial encounter: Secondary | ICD-10-CM

## 2017-08-27 DIAGNOSIS — Y999 Unspecified external cause status: Secondary | ICD-10-CM | POA: Diagnosis not present

## 2017-08-27 DIAGNOSIS — Y92410 Unspecified street and highway as the place of occurrence of the external cause: Secondary | ICD-10-CM | POA: Diagnosis not present

## 2017-08-27 DIAGNOSIS — S3992XA Unspecified injury of lower back, initial encounter: Secondary | ICD-10-CM | POA: Diagnosis present

## 2017-08-27 MED ORDER — SUMATRIPTAN SUCCINATE 6 MG/0.5ML ~~LOC~~ SOLN
6.0000 mg | Freq: Once | SUBCUTANEOUS | Status: AC
  Administered 2017-08-27: 6 mg via SUBCUTANEOUS
  Filled 2017-08-27: qty 0.5

## 2017-08-27 MED ORDER — IBUPROFEN 600 MG PO TABS
600.0000 mg | ORAL_TABLET | Freq: Once | ORAL | Status: AC
  Administered 2017-08-27: 600 mg via ORAL
  Filled 2017-08-27: qty 1

## 2017-08-27 MED ORDER — IBUPROFEN 600 MG PO TABS: 600.0000 mg | ORAL_TABLET | Freq: Three times a day (TID) | ORAL | 0 refills | Status: AC | PRN
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
## 2017-08-27 NOTE — Discharge Instructions (Signed)
It is normal for your back to hurt even more tomorrow than it does today.  Please take ibuprofen 3 times a day as needed for symptomatic control and follow-up with your primary care physician as needed.  Return to the emergency department for any concerns.  It was a pleasure to take care of you today, and thank you for coming to our emergency department.  If you have any questions or concerns before leaving please ask the nurse to grab me and I'm more than happy to go through your aftercare instructions again.  If you were prescribed any opioid pain medication today such as Norco, Vicodin, Percocet, morphine, hydrocodone, or oxycodone please make sure you do not drive when you are taking this medication as it can alter your ability to drive safely.  If you have any concerns once you are home that you are not improving or are in fact getting worse before you can make it to your follow-up appointment, please do not hesitate to call 911 and come back for further evaluation.  Merrily BrittleNeil Gaurav Baldree, MD
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
## 2018-07-28 DEATH — deceased

## 2019-09-10 IMAGING — DX DG ANKLE COMPLETE 3+V*L*
3 series · 3 of 3 positions shown · non-contrast
Comparison: None.

CLINICAL DATA: Left ankle pain after injury down stairs tonight.

EXAM:
LEFT ANKLE COMPLETE - 3+ VIEW

[ankle ap]
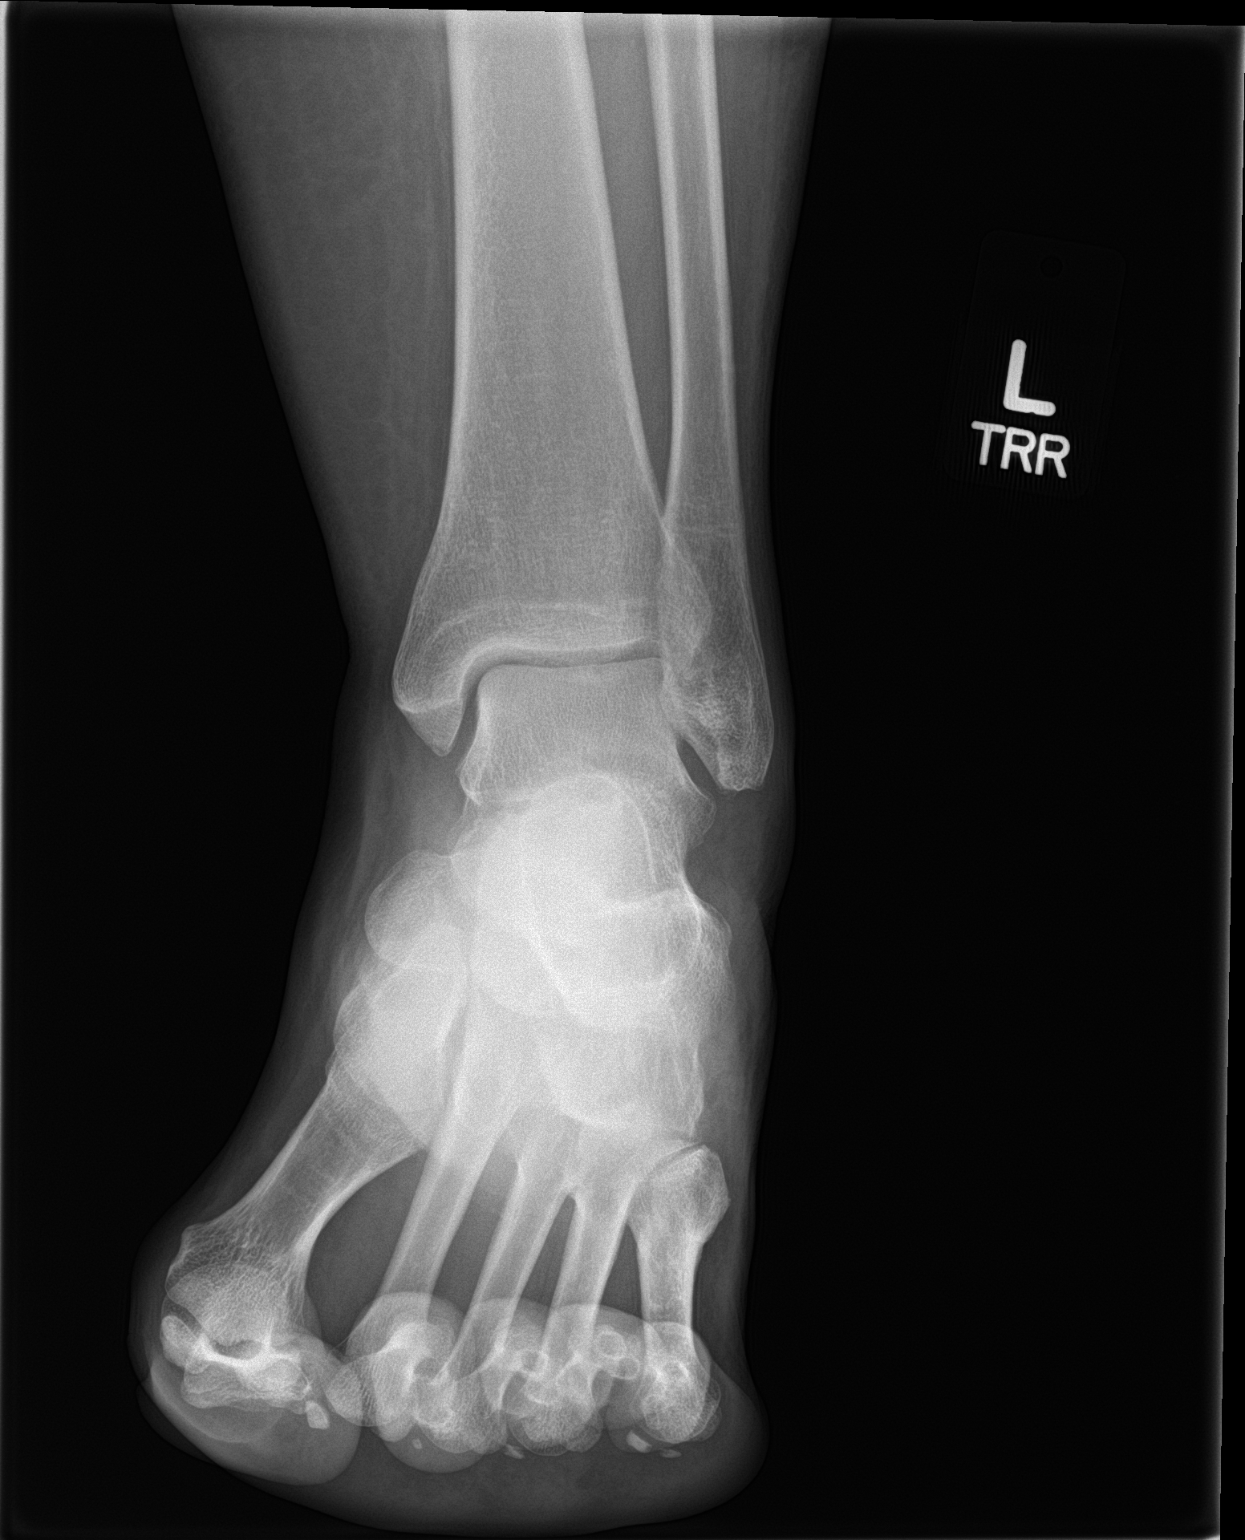

[ankle obl]
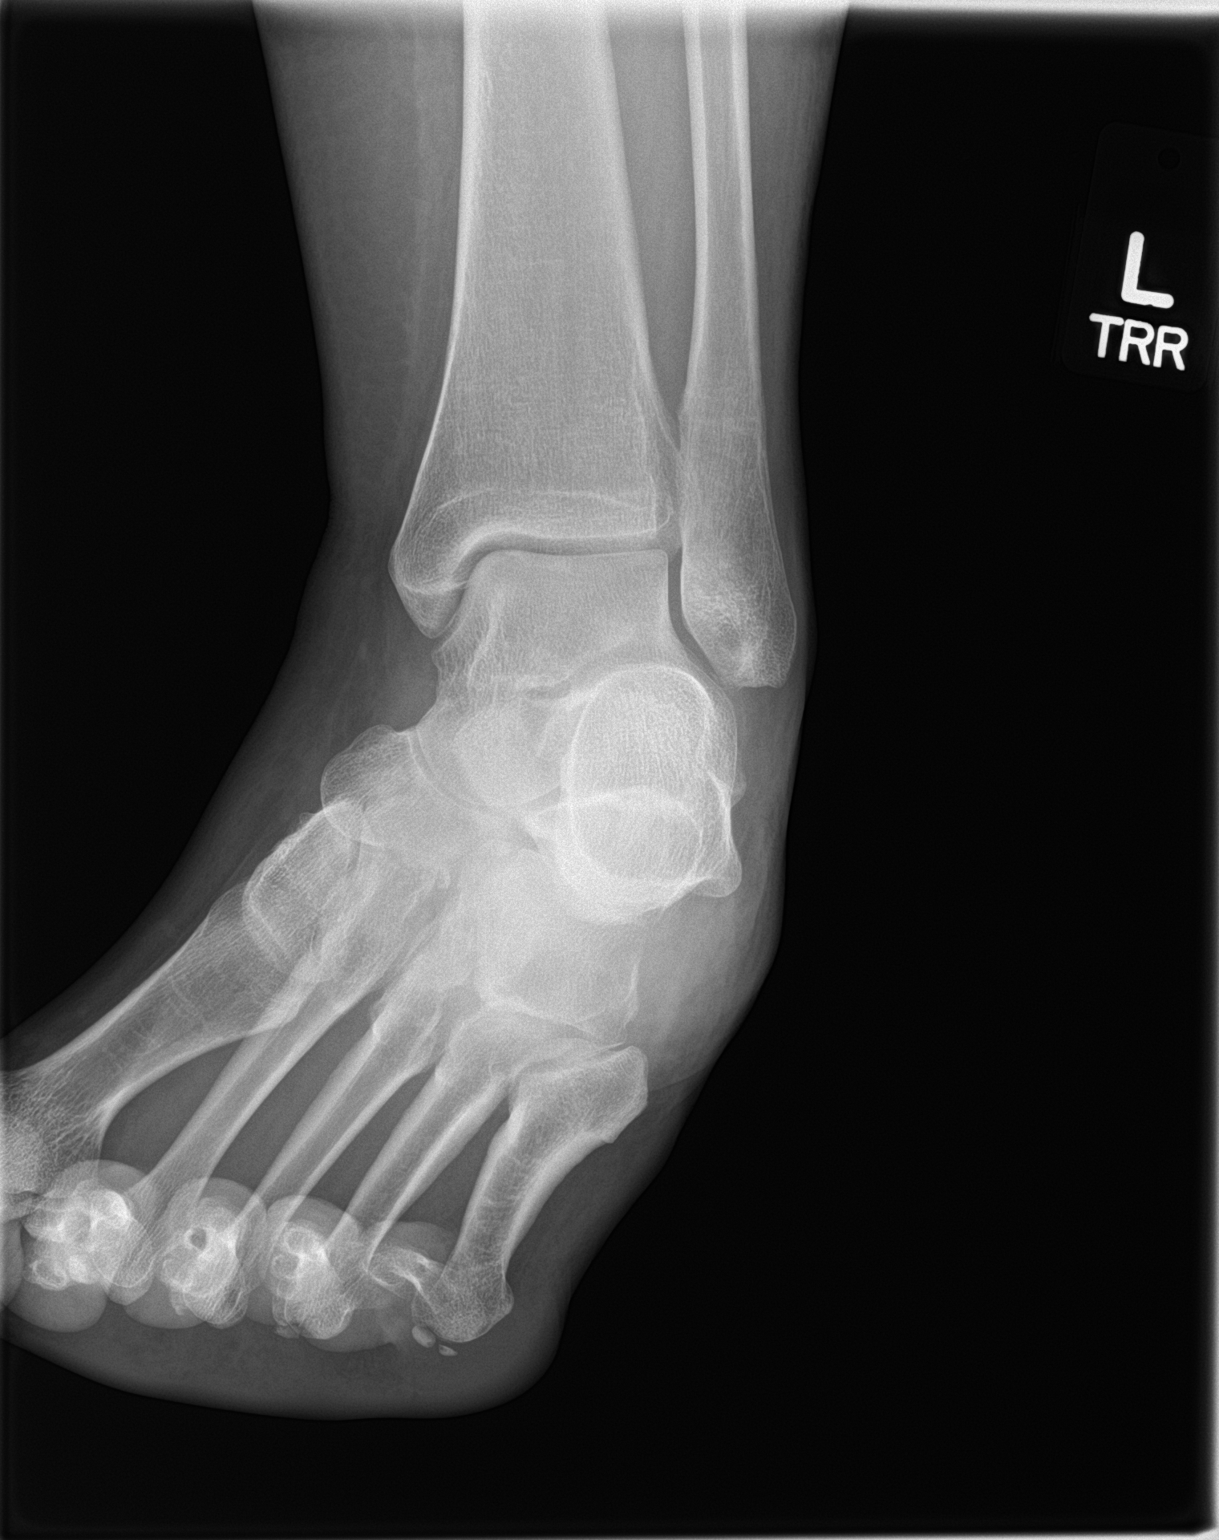

[ankle lat]
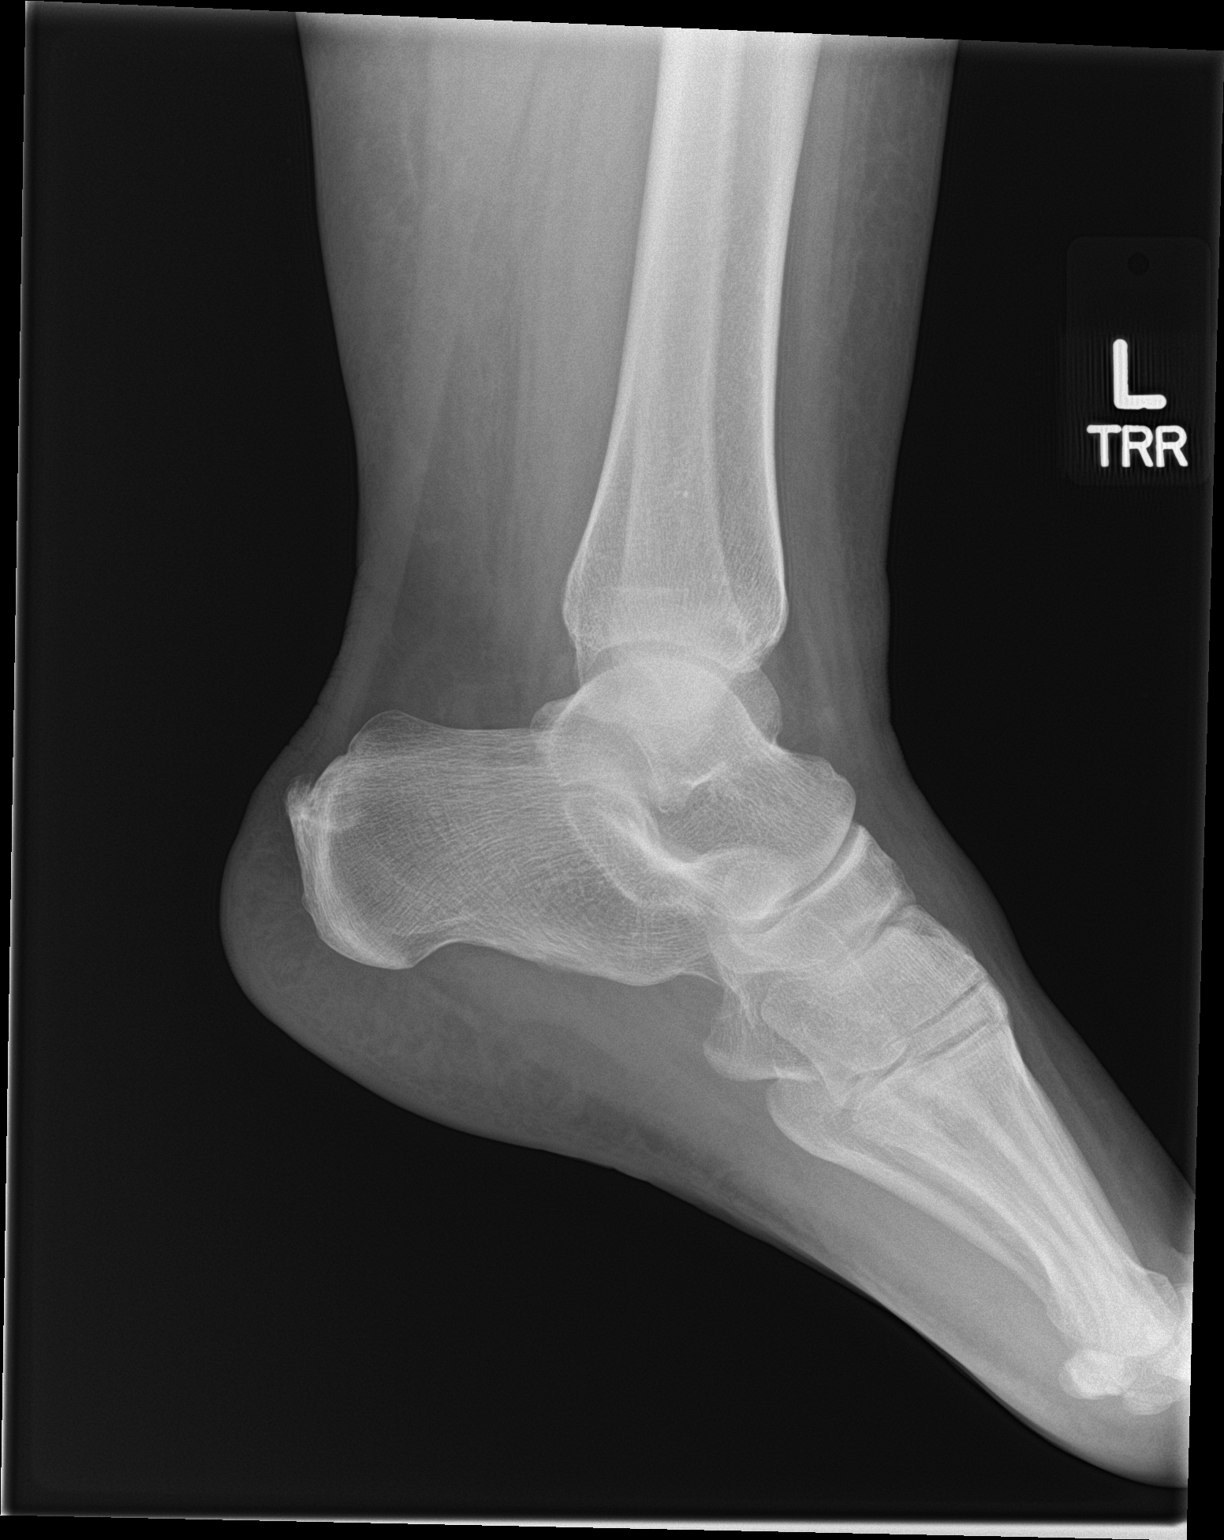

[3 of 3 positions shown; findings below may reference images not displayed]

FINDINGS: There is no evidence of fracture, dislocation, or joint effusion.
There is no evidence of arthropathy or other focal bone abnormality.
Soft tissues are unremarkable.
IMPRESSION: Normal left ankle.
